# Patient Record
Sex: Female | Born: 2005 | Race: Black or African American | Hispanic: No | Marital: Single | Smoking: Never smoker
Health system: Southern US, Community
[De-identification: ages and names within clinical notes are randomized; demographics above are authoritative.]

## PROBLEM LIST (undated history)

## (undated) DIAGNOSIS — K649 Unspecified hemorrhoids: Secondary | ICD-10-CM

## (undated) HISTORY — PX: SKIN BIOPSY: SHX1

---
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
## 2024-02-23 ENCOUNTER — Emergency Department (HOSPITAL_BASED_OUTPATIENT_CLINIC_OR_DEPARTMENT_OTHER)

## 2024-02-23 ENCOUNTER — Other Ambulatory Visit: Payer: Self-pay

## 2024-02-23 ENCOUNTER — Encounter (HOSPITAL_BASED_OUTPATIENT_CLINIC_OR_DEPARTMENT_OTHER): Payer: Self-pay

## 2024-02-23 ENCOUNTER — Emergency Department (HOSPITAL_BASED_OUTPATIENT_CLINIC_OR_DEPARTMENT_OTHER)
Admission: EM | Admit: 2024-02-23 | Discharge: 2024-02-23 | Disposition: A | Discharge status: Home / Self Care | Attending: Emergency Medicine | Admitting: Emergency Medicine

## 2024-02-23 DIAGNOSIS — K625 Hemorrhage of anus and rectum: Secondary | ICD-10-CM | POA: Diagnosis present

## 2024-02-23 DIAGNOSIS — K5900 Constipation, unspecified: Secondary | ICD-10-CM | POA: Insufficient documentation

## 2024-02-23 HISTORY — DX: Unspecified hemorrhoids: K64.9

## 2024-02-23 LAB — CBC WITH DIFFERENTIAL/PLATELET
Abs Immature Granulocytes: 0.01 K/uL (ref 0.00–0.07)
Basophils Absolute: 0 K/uL (ref 0.0–0.1)
Basophils Relative: 0 %
Eosinophils Absolute: 0 K/uL (ref 0.0–0.5)
Eosinophils Relative: 1 %
HCT: 38.9 % (ref 36.0–46.0)
Hemoglobin: 13.4 g/dL (ref 12.0–15.0)
Immature Granulocytes: 0 %
Lymphocytes Relative: 33 %
Lymphs Abs: 2.2 K/uL (ref 0.7–4.0)
MCH: 31.5 pg (ref 26.0–34.0)
MCHC: 34.4 g/dL (ref 30.0–36.0)
MCV: 91.3 fL (ref 80.0–100.0)
Monocytes Absolute: 0.4 K/uL (ref 0.1–1.0)
Monocytes Relative: 6 %
Neutro Abs: 3.9 K/uL (ref 1.7–7.7)
Neutrophils Relative %: 60 %
Platelets: 270 K/uL (ref 150–400)
RBC: 4.26 MIL/uL (ref 3.87–5.11)
RDW: 13.4 % (ref 11.5–15.5)
WBC: 6.6 K/uL (ref 4.0–10.5)
nRBC: 0 % (ref 0.0–0.2)

## 2024-02-23 LAB — COMPREHENSIVE METABOLIC PANEL WITH GFR
ALT: 14 U/L (ref 0–44)
AST: 18 U/L (ref 15–41)
Albumin: 4.3 g/dL (ref 3.5–5.0)
Alkaline Phosphatase: 81 U/L (ref 38–126)
Anion gap: 10 (ref 5–15)
BUN: 11 mg/dL (ref 6–20)
CO2: 24 mmol/L (ref 22–32)
Calcium: 9.7 mg/dL (ref 8.9–10.3)
Chloride: 107 mmol/L (ref 98–111)
Creatinine, Ser: 0.71 mg/dL (ref 0.44–1.00)
GFR, Estimated: >60 mL/min (ref >60)
Glucose, Bld: 85 mg/dL (ref 70–99)
Potassium: 3.6 mmol/L (ref 3.5–5.1)
Sodium: 141 mmol/L (ref 135–145)
Total Bilirubin: 0.7 mg/dL (ref 0.0–1.2)
Total Protein: 7.4 g/dL (ref 6.5–8.1)

## 2024-02-23 LAB — LIPASE, BLOOD: Lipase: 27 U/L (ref 11–51)

## 2024-02-23 LAB — URINALYSIS, ROUTINE W REFLEX MICROSCOPIC
Bilirubin Urine: NEGATIVE
Glucose, UA: NEGATIVE mg/dL
Hgb urine dipstick: NEGATIVE
Ketones, ur: NEGATIVE mg/dL
Leukocytes,Ua: NEGATIVE
Nitrite: NEGATIVE
Protein, ur: NEGATIVE mg/dL
Specific Gravity, Urine: 1.013 (ref 1.005–1.030)
pH: 6.5 (ref 5.0–8.0)

## 2024-02-23 LAB — PROTIME-INR
INR: 1 (ref 0.8–1.2)
Prothrombin Time: 13.4 s (ref 11.4–15.2)

## 2024-02-23 LAB — PREGNANCY, URINE: Preg Test, Ur: NEGATIVE

## 2024-02-23 LAB — OCCULT BLOOD X 1 CARD TO LAB, STOOL: Fecal Occult Bld: NEGATIVE

## 2024-02-23 MED ORDER — DOCUSATE SODIUM 100 MG PO CAPS: 100.0000 mg | ORAL_CAPSULE | Freq: Two times a day (BID) | ORAL | 0 refills | Status: AC

## 2024-02-23 MED ORDER — POLYETHYLENE GLYCOL 3350 17 G PO PACK: 17.0000 g | PACK | Freq: Every day | ORAL | 0 refills | Status: AC

## 2024-02-23 MED ORDER — ACETAMINOPHEN 500 MG PO TABS
1000.0000 mg | ORAL_TABLET | Freq: Once | ORAL | Status: AC
  Administered 2024-02-23: 1000 mg via ORAL
  Filled 2024-02-23: qty 2

## 2024-02-23 MED ORDER — MAGNESIUM CITRATE PO SOLN: 1.0000 | Freq: Once | ORAL | 0 refills | Status: AC

## 2024-02-23 MED ORDER — ONDANSETRON HCL 4 MG/2ML IJ SOLN
4.0000 mg | Freq: Once | INTRAMUSCULAR | Status: AC
  Administered 2024-02-23: 4 mg via INTRAVENOUS
  Filled 2024-02-23: qty 2

## 2024-02-23 MED ORDER — IOHEXOL 300 MG/ML  SOLN
100.0000 mL | Freq: Once | INTRAMUSCULAR | Status: AC | PRN
  Administered 2024-02-23: 100 mL via INTRAVENOUS

## 2024-02-23 NOTE — ED Triage Notes (Signed)
 Rectal pain, rectal bleeding, and nausea onset yesterday. PMH hemorrhoids and constipation. Last BM over 1 week ago. Mild abd pain.

## 2024-02-23 NOTE — Discharge Instructions (Signed)
 Evaluation today revealed that you are constipated.  At this time recommend that you increase your fiber intake and make sure that you are well-hydrated every day with water and Gatorade.  Also sent MiraLAX and magnesium citrate to your pharmacy.  If your symptoms worsen or you develop abdominal pain and/or fever or any other concerning symptom please return to ED for further evaluation.  Otherwise recommend PCP follow-up.

## 2024-02-23 NOTE — ED Provider Notes (Cosign Needed Addendum)
 [place] [facility] DEPARTMENT AT [facility] Provider Note   CSN: [id number] Arrival date & time: 02/23/24  1442     Patient presents with: Rectal Bleeding  HPI Kinze Labo is a 18 y.o. female presenting for rectal bleeding.  Started started yesterday.  She states she noticed some bright red-tinged blood in the toilet bowl when she was trying to have a bowel movement yesterday and noticed it again this morning.  She does endorse rectal pain with BMs.  States she has not had a bowel movement in over a week but still passing gas.  Denies abdominal pain at this time but does endorse nausea.  Denies vomiting.  Denies fever.  Denies shortness of breath, fatigue or chest pain.  Does report a history of hemorrhoids and constipation.  Denies urinary and vaginal symptoms.  Past Medical History:  Diagnosis Date   Hemorrhoids        Rectal Bleeding      Prior to Admission medications   Medication Sig Start Date End Date Taking? Authorizing Provider  docusate sodium (COLACE) 100 MG capsule Take 1 capsule (100 mg total) by mouth every 12 (twelve) hours. 02/23/24  Yes Lang Norleen POUR, PA-C  magnesium citrate SOLN Take 296 mLs (1 Bottle total) by mouth once for 1 dose. 02/23/24 02/23/24 Yes Ibeth Fahmy K, PA-C  polyethylene glycol (MIRALAX) 17 g packet Take 17 g by mouth daily. 02/23/24  Yes Darika Ildefonso K, PA-C    Allergies: Blueberry fruit extract [vaccinium angustifolium]    Review of Systems  Gastrointestinal:  Positive for hematochezia.    Updated Vital Signs BP 112/74   Pulse 100   Temp 98.7 F (37.1 C) (Oral)   Resp 16   Ht 5' 6 (1.676 m)   Wt 75.8 kg   LMP 02/04/2024 (Exact Date)   SpO2 100%   BMI 26.95 kg/m   Physical Exam Vitals and nursing note reviewed. Exam conducted with a chaperone present.  HENT:     Head: Normocephalic and atraumatic.     Mouth/Throat:     Mouth: Mucous membranes are moist.  Eyes:     General:        Right eye: No  discharge.        Left eye: No discharge.     Conjunctiva/sclera: Conjunctivae normal.  Cardiovascular:     Rate and Rhythm: Normal rate and regular rhythm.     Pulses: Normal pulses.     Heart sounds: Normal heart sounds.  Pulmonary:     Effort: Pulmonary effort is normal.     Breath sounds: Normal breath sounds.  Abdominal:     General: Abdomen is flat. There is no distension.     Palpations: Abdomen is soft.     Tenderness: There is no abdominal tenderness.  Genitourinary:    Rectum: Guaiac result negative. No mass, tenderness, anal fissure, external hemorrhoid or internal hemorrhoid. Normal anal tone.  Skin:    General: Skin is warm and dry.  Neurological:     General: No focal deficit present.  Psychiatric:        Mood and Affect: Mood normal.     (all labs ordered are listed, but only abnormal results are displayed) Labs Reviewed  URINALYSIS, ROUTINE W REFLEX MICROSCOPIC - Abnormal; Notable for the following components:      Result Value   Color, Urine COLORLESS (*)    All other components within normal limits  OCCULT BLOOD X 1 CARD TO LAB, STOOL  CBC  WITH DIFFERENTIAL/PLATELET  PROTIME-INR  PREGNANCY, URINE  COMPREHENSIVE METABOLIC PANEL WITH GFR  LIPASE, BLOOD    EKG: None  Radiology: CT ABDOMEN PELVIS W CONTRAST Result Date: 02/23/2024 CLINICAL DATA:  Provided history: Bowel obstruction suspected ER triage note states rectal pain and bleeding. Nausea. No bowel movement for 1 week. EXAM: CT ABDOMEN AND PELVIS WITH CONTRAST TECHNIQUE: Multidetector CT imaging of the abdomen and pelvis was performed using the standard protocol following bolus administration of intravenous contrast. RADIATION DOSE REDUCTION: This exam was performed according to the departmental dose-optimization program which includes automated exposure control, adjustment of the mA and/or kV according to patient size and/or use of iterative reconstruction technique. CONTRAST:  100mL OMNIPAQUE  IOHEXOL 300 MG/ML  SOLN COMPARISON:  None Available. FINDINGS: Lower chest: Clear lung bases. Hepatobiliary: No focal liver abnormality is seen. No gallstones, gallbladder wall thickening, or biliary dilatation. Pancreas: Unremarkable. No pancreatic ductal dilatation or surrounding inflammatory changes. Spleen: Normal in size without focal abnormality. Adrenals/Urinary Tract: Normal adrenal glands. No hydronephrosis or renal inflammation. No evidence of renal stone. Partially distended urinary bladder, normal for degree of distension. Stomach/Bowel: Decompressed stomach. No small bowel obstruction or abnormal distention. No small bowel wall thickening. Normal appendix, for example series 4, image 58. Large volume of stool throughout the colon. There is stool distending the rectum, rectal distention of 6.7 cm. No colonic or rectal wall thickening. Vascular/Lymphatic: No acute vascular findings. Normal caliber abdominal aorta. Patent portal, splenic, and mesenteric veins. No suspicious lymphadenopathy. Reproductive: Peripherally enhancing corpus luteal cyst on the left. The uterus is slightly displaced anteriorly by stool in the rectum. There is a small amount of free fluid in the dependent pelvis and right adnexa. Other: No abdominal ascites. No free air. No abdominal wall hernia. Musculoskeletal: There are no acute or suspicious osseous abnormalities. IMPRESSION: 1. Large volume of stool throughout the colon with stool distending the rectum, suggesting constipation. No colonic or rectal wall thickening. 2. Peripherally enhancing corpus luteal cyst on the left. Small amount of free fluid in the dependent pelvis and right adnexa, may represent recently ruptured cyst or be physiologic. Electronically Signed   By: Andrea Gasman M.D.   On: 02/23/2024 17:18     Procedures   Medications Ordered in the ED  ondansetron [facility]) injection 4 mg (4 mg Intravenous Given 02/23/24 1559)  acetaminophen (TYLENOL) tablet  1,000 mg (1,000 mg Oral Given 02/23/24 1557)  iohexol (OMNIPAQUE) 300 MG/ML solution 100 mL (100 mLs Intravenous Contrast Given 02/23/24 1633)                                    Medical Decision Making Amount and/or Complexity of Data Reviewed Labs: ordered. Radiology: ordered.  Risk OTC drugs. Prescription drug management.   Initial Impression and Ddx 18 yo well appearing female presenting for rectal bleeding and constipation.  Exam was unremarkable.  DDx includes AVM, diverticulosis, upper versus lower GI bleed, bowel obstruction, intra-abdominal infection or inflammation, other. Patient PMH that increases complexity of ED encounter: History of hemorrhoids  Interpretation of Diagnostics  - I independent reviewed and interpreted the labs as followed: Hemoccult negative, all other labs within normal limits  - I independently visualized the following imaging with scope of interpretation limited to determining acute life threatening conditions related to emergency care: CT, which revealed large stool burden with distended rectum  Patient Reassessment and Ultimate Disposition/Management On reassessment, patient remained hemodynamically stable,  without abdominal pain.  Work appears unremarkable but revealing constipation.  Advised increased fiber intake and assertive hydration but also sent MiraLAX, colace, and p.o. magnesium to her pharmacy as well.  Advised her to follow-up with her PCP.  Discussed return precautions.  Discharge.  Patient management required discussion with the following services or consulting groups:  None  Complexity of Problems Addressed Acute complicated illness or Injury  Additional Data Reviewed and Analyzed Further history obtained from: Past medical history and medications listed in the EMR and Prior ED visit notes  Patient Encounter Risk Assessment Consideration of hospitalization      Final diagnoses:  Constipation, unspecified constipation type     ED Discharge Orders          Ordered    polyethylene glycol (MIRALAX) 17 g packet  Daily        02/23/24 1727    magnesium citrate SOLN   Once        02/23/24 1727    docusate sodium (COLACE) 100 MG capsule  Every 12 hours        02/23/24 1738               Yug Loria K, PA-C 02/23/24 1739    Bernard Drivers, MD 02/25/24 [phone]

## 2024-05-29 ENCOUNTER — Encounter (HOSPITAL_BASED_OUTPATIENT_CLINIC_OR_DEPARTMENT_OTHER): Payer: Self-pay | Admitting: Emergency Medicine

## 2024-05-29 ENCOUNTER — Other Ambulatory Visit: Payer: Self-pay

## 2024-05-29 DIAGNOSIS — R10A1 Flank pain, right side: Secondary | ICD-10-CM | POA: Insufficient documentation

## 2024-05-29 DIAGNOSIS — R10A2 Flank pain, left side: Secondary | ICD-10-CM | POA: Insufficient documentation

## 2024-05-29 DIAGNOSIS — M549 Dorsalgia, unspecified: Secondary | ICD-10-CM | POA: Insufficient documentation

## 2024-05-29 DIAGNOSIS — Y92410 Unspecified street and highway as the place of occurrence of the external cause: Secondary | ICD-10-CM | POA: Diagnosis not present
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
## 2024-05-30 ENCOUNTER — Emergency Department (HOSPITAL_BASED_OUTPATIENT_CLINIC_OR_DEPARTMENT_OTHER)
Admission: EM | Admit: 2024-05-30 | Discharge: 2024-05-30 | Disposition: A | Discharge status: Home / Self Care | Attending: Emergency Medicine | Admitting: Emergency Medicine

## 2024-05-30 MED ORDER — IBUPROFEN 400 MG PO TABS
400.0000 mg | ORAL_TABLET | Freq: Once | ORAL | Status: AC
  Administered 2024-05-30: 400 mg via ORAL
  Filled 2024-05-30: qty 1

## 2024-05-30 MED ORDER — IBUPROFEN 600 MG PO TABS: 600.0000 mg | ORAL_TABLET | Freq: Four times a day (QID) | ORAL | 0 refills | Status: AC | PRN

## 2024-05-30 MED ORDER — CYCLOBENZAPRINE HCL 5 MG PO TABS: 5.0000 mg | ORAL_TABLET | Freq: Two times a day (BID) | ORAL | 0 refills | Status: AC | PRN

## 2024-05-30 MED ORDER — CYCLOBENZAPRINE HCL 5 MG PO TABS
5.0000 mg | ORAL_TABLET | Freq: Once | ORAL | Status: AC
  Administered 2024-05-30: 5 mg via ORAL
  Filled 2024-05-30: qty 1
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
